# Patient Record
Sex: Male | Born: 1975 | Race: White | Hispanic: No | Marital: Single | Smoking: Never smoker
Health system: Southern US, Community
[De-identification: ages and names within clinical notes are randomized; demographics above are authoritative.]

## PROBLEM LIST (undated history)

## (undated) HISTORY — PX: SPINAL FUSION: SHX223

## (undated) HISTORY — PX: KNEE ARTHROSCOPY WITH ANTERIOR CRUCIATE LIGAMENT (ACL) REPAIR: SHX5644

---

## 2007-08-12 ENCOUNTER — Encounter: Admission: RE | Admit: 2007-08-12 | Discharge: 2007-09-03 | Payer: Self-pay | Admitting: Family Medicine

## 2007-09-05 ENCOUNTER — Encounter: Admission: RE | Admit: 2007-09-05 | Discharge: 2007-09-23 | Payer: Self-pay | Admitting: Family Medicine

## 2009-09-12 ENCOUNTER — Encounter: Admission: RE | Admit: 2009-09-12 | Discharge: 2009-09-12 | Payer: Self-pay | Admitting: Orthopedic Surgery

## 2017-05-14 DIAGNOSIS — B09 Unspecified viral infection characterized by skin and mucous membrane lesions: Secondary | ICD-10-CM | POA: Diagnosis not present

## 2017-09-04 DIAGNOSIS — Z23 Encounter for immunization: Secondary | ICD-10-CM | POA: Diagnosis not present

## 2017-09-04 DIAGNOSIS — Z0000 Encounter for general adult medical examination without abnormal findings: Secondary | ICD-10-CM | POA: Diagnosis not present

## 2017-09-04 DIAGNOSIS — Z13220 Encounter for screening for lipoid disorders: Secondary | ICD-10-CM | POA: Diagnosis not present

## 2017-10-09 DIAGNOSIS — R52 Pain, unspecified: Secondary | ICD-10-CM | POA: Diagnosis not present

## 2018-04-30 DIAGNOSIS — M25562 Pain in left knee: Secondary | ICD-10-CM | POA: Diagnosis not present

## 2018-04-30 DIAGNOSIS — M23612 Other spontaneous disruption of anterior cruciate ligament of left knee: Secondary | ICD-10-CM | POA: Diagnosis not present

## 2018-05-08 DIAGNOSIS — M23612 Other spontaneous disruption of anterior cruciate ligament of left knee: Secondary | ICD-10-CM | POA: Diagnosis not present

## 2018-05-12 DIAGNOSIS — S83512D Sprain of anterior cruciate ligament of left knee, subsequent encounter: Secondary | ICD-10-CM | POA: Diagnosis not present

## 2018-05-12 DIAGNOSIS — S83242D Other tear of medial meniscus, current injury, left knee, subsequent encounter: Secondary | ICD-10-CM | POA: Diagnosis not present

## 2018-07-29 DIAGNOSIS — G8918 Other acute postprocedural pain: Secondary | ICD-10-CM | POA: Diagnosis not present

## 2018-07-29 DIAGNOSIS — S83512A Sprain of anterior cruciate ligament of left knee, initial encounter: Secondary | ICD-10-CM | POA: Diagnosis not present

## 2018-07-29 DIAGNOSIS — M94262 Chondromalacia, left knee: Secondary | ICD-10-CM | POA: Diagnosis not present

## 2018-07-30 ENCOUNTER — Emergency Department (HOSPITAL_COMMUNITY)
Admission: EM | Admit: 2018-07-30 | Discharge: 2018-07-31 | Disposition: A | Discharge status: Home / Self Care | Payer: 59 | Attending: Emergency Medicine | Admitting: Emergency Medicine

## 2018-07-30 ENCOUNTER — Encounter (HOSPITAL_COMMUNITY): Payer: Self-pay | Admitting: Emergency Medicine

## 2018-07-30 ENCOUNTER — Other Ambulatory Visit: Payer: Self-pay

## 2018-07-30 DIAGNOSIS — M25562 Pain in left knee: Secondary | ICD-10-CM | POA: Diagnosis not present

## 2018-07-30 DIAGNOSIS — G8918 Other acute postprocedural pain: Secondary | ICD-10-CM | POA: Insufficient documentation

## 2018-07-30 NOTE — ED Triage Notes (Signed)
Patient presents with knee pain s/p knee surgery at 0900. Patient states no increase in drainage or swelling or redness. Patient ambulatory on crutches.

## 2018-07-31 MED ORDER — ALUM & MAG HYDROXIDE-SIMETH 200-200-20 MG/5ML PO SUSP
30.0000 mL | Freq: Once | ORAL | Status: AC
  Administered 2018-07-31: 30 mL via ORAL
  Filled 2018-07-31: qty 30

## 2018-07-31 MED ORDER — FAMOTIDINE IN NACL 20-0.9 MG/50ML-% IV SOLN
20.0000 mg | Freq: Once | INTRAVENOUS | Status: AC
  Administered 2018-07-31: 20 mg via INTRAVENOUS
  Filled 2018-07-31: qty 50

## 2018-07-31 MED ORDER — FENTANYL CITRATE (PF) 100 MCG/2ML IJ SOLN
100.0000 ug | Freq: Once | INTRAMUSCULAR | Status: AC
  Administered 2018-07-31: 100 ug via INTRAVENOUS
  Filled 2018-07-31: qty 2

## 2018-07-31 MED ORDER — PANTOPRAZOLE SODIUM 40 MG PO TBEC
40.0000 mg | DELAYED_RELEASE_TABLET | Freq: Once | ORAL | Status: AC
  Administered 2018-07-31: 40 mg via ORAL
  Filled 2018-07-31: qty 1

## 2018-07-31 NOTE — ED Provider Notes (Signed)
[facility]-EMERGENCY DEPT Provider Note: Lowella Dell, MD, FACEP  CSN: [id number] MRN: [id number] ARRIVAL: 07/30/18 at 2310 ROOM: WA22/WA22   CHIEF COMPLAINT  Post-op Problem (Pain)   HISTORY OF PRESENT ILLNESS  07/31/18 12:35 AM Benjamin Saunders is a 42 y.o. male who underwent a laparoscopic left ACL and meniscal repair by Dr. Thomasena Edis of [facility] 2 days ago.  He is having severe pain in his left knee which she describes as feeling like his left knee is being hyperextended and twisted.  He did not get adequate relief with the oxycodone he was prescribed postoperatively.  Yesterday he was prescribed hydromorphone.  Despite taking 4 mg of hydromorphone at 10 PM yesterday evening he is still having severe pain in that knee.  He denies postoperative injury to that knee.  He denies swelling or discoloration of the knee.  He has been applying ice as instructed.  His legs are in compression stockings for DVT prophylaxis.  He also complains of indigestion since surgery.    No past medical history on file.  Past Surgical History:  Procedure Laterality Date  . KNEE ARTHROSCOPY WITH ANTERIOR CRUCIATE LIGAMENT (ACL) REPAIR    . SPINAL FUSION      No family history on file.  Social History   Tobacco Use  . Smoking status: Never Smoker  . Smokeless tobacco: Never Used  Substance Use Topics  . Alcohol use: Yes    Comment: occassional  . Drug use: Never    Prior to Admission medications   Medication Sig Start Date End Date Taking? Authorizing Provider  doxycycline (VIBRAMYCIN) 100 MG capsule Take 100 mg by mouth 2 (two) times daily. 07/29/18  Yes [provider]  HYDROmorphone (DILAUDID) 2 MG tablet Take 2 mg by mouth every 4 (four) hours as needed for pain. 07/30/18  Yes [provider]  methocarbamol (ROBAXIN) 500 MG tablet Take 500 mg by mouth 3 (three) times daily as needed for muscle spasms. 07/29/18  Yes [provider]    Allergies Penicillins   REVIEW OF  SYSTEMS  Negative except as noted here or in the History of Present Illness.   PHYSICAL EXAMINATION  Initial Vital Signs Blood pressure 122/66, pulse 87, temperature 98.2 F (36.8 C), temperature source Oral, resp. rate 20, height 5\' 9"  (1.753 m), weight 91 kg, SpO2 98 %.  Examination General: Well-developed, well-nourished male in no acute distress; appearance consistent with age of record HENT: normocephalic; atraumatic Eyes: pupils equal, round and reactive to light; extraocular muscles intact Neck: supple Heart: regular rate and rhythm Lungs: clear to auscultation bilaterally Abdomen: soft; nondistended; nontender; bowel sounds present Extremities: No deformity; laparoscopic incisions of left knee without signs of infection or bleeding; tenderness of left knee without swelling, ecchymosis or erythema; movement of left knee not attempted due to recent surgery Neurologic: Awake, alert and oriented; motor function intact in all extremities and symmetric; no facial droop Skin: Warm and dry Psychiatric: Normal mood and affect   RESULTS  Summary of this visit's results, reviewed by myself:   EKG Interpretation  Date/Time:    Ventricular Rate:    PR Interval:    QRS Duration:   QT Interval:    QTC Calculation:   R Axis:     Text Interpretation:        Laboratory Studies: No results found for this or any previous visit (from the past 24 hour(s)). Imaging Studies: No results found.  ED COURSE and MDM  Nursing notes and initial vitals signs,  including pulse oximetry, reviewed.  Vitals:   07/30/18 2323 07/30/18 2324 07/31/18 0100 07/31/18 0106  BP: 122/66  (!) 130/92   Pulse: 87  85 74  Resp: 20  18   Temp: 98.2 F (36.8 C)     TempSrc: Oral     SpO2: 98%  94% 96%  Weight:  91 kg    Height:  5\' 9"  (1.753 m)     2:44 AM Patient's pain significantly relieved with fentanyl 100 mcg IV.  He states he is ready to go home.  PROCEDURES    ED DIAGNOSES     ICD-10-CM    1. Postoperative pain G89.18        Zehra Rucci, Jonny RuizJohn, MD 07/31/18 [phone]

## 2018-07-31 NOTE — ED Notes (Signed)
Incisions do not show signs of infection, knee is not hot to the touch

## 2018-08-04 DIAGNOSIS — M25562 Pain in left knee: Secondary | ICD-10-CM | POA: Diagnosis not present

## 2018-08-08 DIAGNOSIS — M25562 Pain in left knee: Secondary | ICD-10-CM | POA: Diagnosis not present

## 2018-08-12 DIAGNOSIS — M25562 Pain in left knee: Secondary | ICD-10-CM | POA: Diagnosis not present

## 2018-08-14 DIAGNOSIS — M25562 Pain in left knee: Secondary | ICD-10-CM | POA: Diagnosis not present

## 2018-08-15 DIAGNOSIS — M25562 Pain in left knee: Secondary | ICD-10-CM | POA: Diagnosis not present

## 2018-08-18 DIAGNOSIS — Z4789 Encounter for other orthopedic aftercare: Secondary | ICD-10-CM | POA: Diagnosis not present

## 2018-08-18 DIAGNOSIS — M25562 Pain in left knee: Secondary | ICD-10-CM | POA: Diagnosis not present

## 2018-08-21 DIAGNOSIS — M25562 Pain in left knee: Secondary | ICD-10-CM | POA: Diagnosis not present

## 2018-08-22 DIAGNOSIS — M25562 Pain in left knee: Secondary | ICD-10-CM | POA: Diagnosis not present

## 2018-08-26 DIAGNOSIS — M25562 Pain in left knee: Secondary | ICD-10-CM | POA: Diagnosis not present

## 2018-08-28 DIAGNOSIS — M25562 Pain in left knee: Secondary | ICD-10-CM | POA: Diagnosis not present

## 2018-08-29 DIAGNOSIS — M25562 Pain in left knee: Secondary | ICD-10-CM | POA: Diagnosis not present

## 2018-09-02 DIAGNOSIS — M25562 Pain in left knee: Secondary | ICD-10-CM | POA: Diagnosis not present

## 2018-09-04 DIAGNOSIS — M25562 Pain in left knee: Secondary | ICD-10-CM | POA: Diagnosis not present

## 2018-09-08 DIAGNOSIS — S83512D Sprain of anterior cruciate ligament of left knee, subsequent encounter: Secondary | ICD-10-CM | POA: Diagnosis not present

## 2018-09-09 DIAGNOSIS — M25562 Pain in left knee: Secondary | ICD-10-CM | POA: Diagnosis not present

## 2018-09-11 DIAGNOSIS — M25562 Pain in left knee: Secondary | ICD-10-CM | POA: Diagnosis not present

## 2018-09-15 DIAGNOSIS — M25562 Pain in left knee: Secondary | ICD-10-CM | POA: Diagnosis not present

## 2018-09-18 DIAGNOSIS — M25562 Pain in left knee: Secondary | ICD-10-CM | POA: Diagnosis not present

## 2018-09-22 DIAGNOSIS — M25562 Pain in left knee: Secondary | ICD-10-CM | POA: Diagnosis not present

## 2018-09-24 DIAGNOSIS — M25562 Pain in left knee: Secondary | ICD-10-CM | POA: Diagnosis not present

## 2018-09-30 DIAGNOSIS — M25562 Pain in left knee: Secondary | ICD-10-CM | POA: Diagnosis not present

## 2018-10-02 DIAGNOSIS — M25562 Pain in left knee: Secondary | ICD-10-CM | POA: Diagnosis not present

## 2018-10-06 DIAGNOSIS — M25562 Pain in left knee: Secondary | ICD-10-CM | POA: Diagnosis not present

## 2018-10-08 DIAGNOSIS — M25562 Pain in left knee: Secondary | ICD-10-CM | POA: Diagnosis not present

## 2018-10-10 DIAGNOSIS — M25562 Pain in left knee: Secondary | ICD-10-CM | POA: Diagnosis not present

## 2018-10-14 DIAGNOSIS — M25562 Pain in left knee: Secondary | ICD-10-CM | POA: Diagnosis not present

## 2018-10-16 DIAGNOSIS — M25562 Pain in left knee: Secondary | ICD-10-CM | POA: Diagnosis not present

## 2018-10-16 DIAGNOSIS — G479 Sleep disorder, unspecified: Secondary | ICD-10-CM | POA: Diagnosis not present

## 2018-10-16 DIAGNOSIS — B360 Pityriasis versicolor: Secondary | ICD-10-CM | POA: Diagnosis not present

## 2018-10-16 DIAGNOSIS — Z0000 Encounter for general adult medical examination without abnormal findings: Secondary | ICD-10-CM | POA: Diagnosis not present

## 2018-10-17 DIAGNOSIS — G479 Sleep disorder, unspecified: Secondary | ICD-10-CM | POA: Diagnosis not present

## 2018-10-17 DIAGNOSIS — B360 Pityriasis versicolor: Secondary | ICD-10-CM | POA: Diagnosis not present

## 2018-10-17 DIAGNOSIS — Z0000 Encounter for general adult medical examination without abnormal findings: Secondary | ICD-10-CM | POA: Diagnosis not present

## 2018-10-23 DIAGNOSIS — M25562 Pain in left knee: Secondary | ICD-10-CM | POA: Diagnosis not present

## 2018-10-27 DIAGNOSIS — M25562 Pain in left knee: Secondary | ICD-10-CM | POA: Diagnosis not present

## 2018-10-27 DIAGNOSIS — Z4789 Encounter for other orthopedic aftercare: Secondary | ICD-10-CM | POA: Diagnosis not present

## 2018-11-06 DIAGNOSIS — M25562 Pain in left knee: Secondary | ICD-10-CM | POA: Diagnosis not present

## 2018-11-10 DIAGNOSIS — M25562 Pain in left knee: Secondary | ICD-10-CM | POA: Diagnosis not present

## 2018-11-12 DIAGNOSIS — M25562 Pain in left knee: Secondary | ICD-10-CM | POA: Diagnosis not present

## 2018-11-17 DIAGNOSIS — M25562 Pain in left knee: Secondary | ICD-10-CM | POA: Diagnosis not present

## 2018-12-01 DIAGNOSIS — M25562 Pain in left knee: Secondary | ICD-10-CM | POA: Diagnosis not present

## 2018-12-05 DIAGNOSIS — Z9889 Other specified postprocedural states: Secondary | ICD-10-CM | POA: Diagnosis not present

## 2019-01-19 DIAGNOSIS — M1712 Unilateral primary osteoarthritis, left knee: Secondary | ICD-10-CM | POA: Diagnosis not present

## 2019-01-19 DIAGNOSIS — Z96651 Presence of right artificial knee joint: Secondary | ICD-10-CM | POA: Diagnosis not present

## 2019-01-19 DIAGNOSIS — Z471 Aftercare following joint replacement surgery: Secondary | ICD-10-CM | POA: Diagnosis not present

## 2020-02-05 ENCOUNTER — Emergency Department (HOSPITAL_COMMUNITY)
Admission: EM | Admit: 2020-02-05 | Discharge: 2020-02-05 | Disposition: A | Discharge status: Home / Self Care | Payer: 59 | Attending: Emergency Medicine | Admitting: Emergency Medicine

## 2020-02-05 ENCOUNTER — Other Ambulatory Visit: Payer: Self-pay

## 2020-02-05 ENCOUNTER — Encounter (HOSPITAL_COMMUNITY): Payer: Self-pay

## 2020-02-05 ENCOUNTER — Emergency Department (HOSPITAL_COMMUNITY): Payer: 59

## 2020-02-05 DIAGNOSIS — Z9889 Other specified postprocedural states: Secondary | ICD-10-CM | POA: Diagnosis not present

## 2020-02-05 DIAGNOSIS — M25561 Pain in right knee: Secondary | ICD-10-CM | POA: Diagnosis not present

## 2020-02-05 DIAGNOSIS — G8918 Other acute postprocedural pain: Secondary | ICD-10-CM | POA: Insufficient documentation

## 2020-02-05 MED ORDER — HYDROMORPHONE HCL 1 MG/ML IJ SOLN
1.0000 mg | Freq: Once | INTRAMUSCULAR | Status: AC
Start: 2020-02-05 — End: 2020-02-05
  Administered 2020-02-05: 1 mg via INTRAMUSCULAR
  Filled 2020-02-05: qty 1

## 2020-02-05 MED ORDER — FENTANYL CITRATE (PF) 100 MCG/2ML IJ SOLN
100.0000 ug | Freq: Once | INTRAMUSCULAR | Status: AC
  Administered 2020-02-05: 100 ug via INTRAMUSCULAR
  Filled 2020-02-05: qty 2

## 2020-02-05 MED ORDER — FENTANYL CITRATE (PF) 100 MCG/2ML IJ SOLN: 100.0000 ug | Freq: Once | INTRAMUSCULAR | Status: DC

## 2020-02-05 MED ORDER — FENTANYL CITRATE (PF) 100 MCG/2ML IJ SOLN
50.0000 ug | Freq: Once | INTRAMUSCULAR | Status: AC
Start: 2020-02-05 — End: 2020-02-05
  Administered 2020-02-05: 50 ug via INTRAMUSCULAR
  Filled 2020-02-05: qty 2

## 2020-02-05 NOTE — Discharge Instructions (Addendum)
You received fentanyl, Dilaudid while in the ED.  Your primary surgical team has called in Dilaudid p.o. at your pharmacy of choice.  You may continue taking this along the muscle relaxers to help with your symptoms.  Follow-up at your scheduled appointment on Monday morning.

## 2020-02-05 NOTE — ED Provider Notes (Addendum)
[facility] Provider Note   CSN: [id number] Arrival date & time: 02/05/20  1459     History Chief Complaint  Patient presents with  . post op issues    Benjamin Saunders is a 44 y.o. male.  44 y.o male with a PMH of right ACL reconstruction performed yesterday by Dr. Autumn Messing at [facility] presents to the ED with a chief complaint of increasing pain.  Patient reports his surgery was done at the surgery center, he has had increase in pain and has been unable to manage his pain medication while at home.  He reports he was given a prescription for hydrocodone 5 mg, methocarbamol, antibiotics which she has taken without improvement in his symptoms.  Reports calling the RN at the surgery center who recommended he double up on the hydrocodone, he last took hydrocodone around 3 PM this afternoon exactly an hour prior to arrival.  States the pain has not improved.  Reports pain is exacerbated with any type of movement.  Had a similar episode when he had a reconstruction of his left ACL.  Patient was discharged this morning from surgery center.  Reports no fevers, chills, other complaints.  Patient is on a knee immobilizer, dressing was checked this morning prior to discharge according to patient.   The history is provided by the patient and medical records.       History reviewed. No pertinent past medical history.  There are no problems to display for this patient.   Past Surgical History:  Procedure Laterality Date  . KNEE ARTHROSCOPY WITH ANTERIOR CRUCIATE LIGAMENT (ACL) REPAIR    . SPINAL FUSION         Family History  Problem Relation Age of Onset  . Heart failure Mother   . Hypertension Mother     Social History   Tobacco Use  . Smoking status: Never Smoker  . Smokeless tobacco: Never Used  Substance Use Topics  . Alcohol use: Yes    Comment: occassional  . Drug use: Never    Home Medications Prior to Admission  medications   Medication Sig Start Date End Date Taking? Authorizing Provider  doxycycline (VIBRAMYCIN) 100 MG capsule Take 100 mg by mouth 2 (two) times daily. 07/29/18   [provider]  HYDROmorphone (DILAUDID) 2 MG tablet Take 2 mg by mouth every 4 (four) hours as needed for pain. 07/30/18   [provider]  methocarbamol (ROBAXIN) 500 MG tablet Take 500 mg by mouth 3 (three) times daily as needed for muscle spasms. 07/29/18   [provider]    Allergies    Penicillins  Review of Systems   Review of Systems  Constitutional: Negative for fever.  Musculoskeletal: Positive for arthralgias and joint swelling.    Physical Exam Updated Vital Signs BP 140/79   Pulse 79   Temp 98.2 F (36.8 C) (Oral)   Resp 18   Ht 5\' 9"  (1.753 m)   Wt 88.5 kg   SpO2 99%   BMI 28.80 kg/m   Physical Exam Vitals and nursing note reviewed.  Constitutional:      Appearance: Normal appearance.  HENT:     Head: Normocephalic and atraumatic.  Cardiovascular:     Rate and Rhythm: Normal rate.     Pulses:          Dorsalis pedis pulses are 2+ on the right side.       Posterior tibial pulses are 2+ on the right side.  Pulmonary:     Effort: Pulmonary effort is normal.  Abdominal:     General: Abdomen is flat.  Musculoskeletal:        General: Tenderness present.     Cervical back: Normal range of motion and neck supple.     Right knee: Tenderness present.     Comments: Right knee placed on immobilizer.  Sensation is intact to the distal aspect, pulses are present, capillary refill is intact to his toes.  Tenderness surrounding wound.  Knee is hyperextended.  Immobilizer removed with minimal swelling noted, please see photos attached.  Skin:    General: Skin is warm and dry.  Neurological:     Mental Status: He is alert and oriented to person, place, and time.       ED Results / Procedures / Treatments   Labs (all labs ordered are listed, but only abnormal  results are displayed) Labs Reviewed - No data to display  EKG None  Radiology No results found.  Procedures Procedures (including critical care time)  Medications Ordered in ED Medications  fentaNYL (SUBLIMAZE) injection 50 mcg (50 mcg Intramuscular Given 02/05/20 1611)  HYDROmorphone (DILAUDID) injection 1 mg (1 mg Intramuscular Given 02/05/20 1719)  fentaNYL (SUBLIMAZE) injection 100 mcg (100 mcg Intramuscular Given 02/05/20 1823)    ED Course  I have reviewed the triage vital signs and the nursing notes.  Pertinent labs & imaging results that were available during my care of the patient were reviewed by me and considered in my medical decision making (see chart for details).    MDM Rules/Calculators/A&P  Patient with a PMH of right ACL reconstruction performed by Dr. Thomasena Edis yesterday, reports being admitted overnight for monitoring.  Discharged this morning in stable condition.  Reports increase in pain, has had his pain medication which was prescribed for him at home including hydrocodone 5 mg, Robaxin, antibiotics that he thinks were prescribed for him.  Reports no improvement in his pain.  Attempted to double up pain medication but reports there was no improvement.  According to him when he had his left ACL reconstructed by Dr. Thomasena Edis, he did require IV medication while in the ED the next day.  During evaluation right knee is on the knee immobilizer, wound and dressing was checked this morning by nursing staff according to patient.  Pulses are present distally, capillary refill is intact, sensation is adequate.  Provide patient with pain medication while in the ED and further reevaluate.  Xray of the right knee showed:  1. Small to moderate sized joint effusion with a mild amount of soft  tissue air seen above and below the right patella.  2. Postoperative changes, as described above, without evidence of  acute osseous abnormality.    5:26 PM Spoke to [facility],  who prescribed patient Dilaudid p.o. while at home.  I have updated a picture of the patient's knee on his chart, minimal swelling was noted, he is neurovascularly intact.  He reports mild improvement after 1 mg of Dilaudid IM.  He is asking if he could take Dilaudid p.o. x2 while at home.  I advised him that he tolerated this last time he may continue to do so.  He is otherwise hemodynamically stable, stable for discharge.  6:10 PM patient is refusing discharge at this time, will provide him with additional pain medication.  Of note, patient does have a prescription for Dilaudid at his current pharmacy which he is advised to pick up by his primary  Psychologist, sport and exercise.  Portions of this note were generated with [employer]. Dictation errors may occur despite best attempts at proofreading.  Final Clinical Impression(s) / ED Diagnoses Final diagnoses:  Post-operative pain  Acute pain of right knee    Rx / DC Orders ED Discharge Orders    None       Janeece Fitting, PA-C 02/05/20 [place], Nhan Qualley, PA-C 02/07/20 2357    Daleen Bo, MD 02/13/20 [phone]

## 2020-02-05 NOTE — ED Notes (Signed)
An After Visit Summary was printed and given to the patient. Discharge instructions given and no further questions at this time.  

## 2020-02-05 NOTE — ED Triage Notes (Signed)
Patient states he had right knee surgery yesterday. Patient states he is is excrutiating pain. Patient states the same thing happened with my other knee. "I received a hot of Fentanyl and everything was good after that."

## 2020-02-05 NOTE — ED Notes (Signed)
Pt states his mother drove him here and is taking him home. Pt states we did not manage his pain well. Pt given multiple doses of pain medications (see MAR). Pt knee has minimal swelling as seen by this RN and Claude Manges, PA. Pt also has pain medications ordered from his surgical office to his pharmacy and is aware of this.

## 2021-02-25 IMAGING — CR DG KNEE 1-2V*R*
2 series · 2 of 2 positions shown · non-contrast
Comparison: None.

CLINICAL DATA: Status post right knee surgery yesterday with
subsequent pain.

EXAM:
RIGHT KNEE - 1-2 VIEW

[t knee ap right]
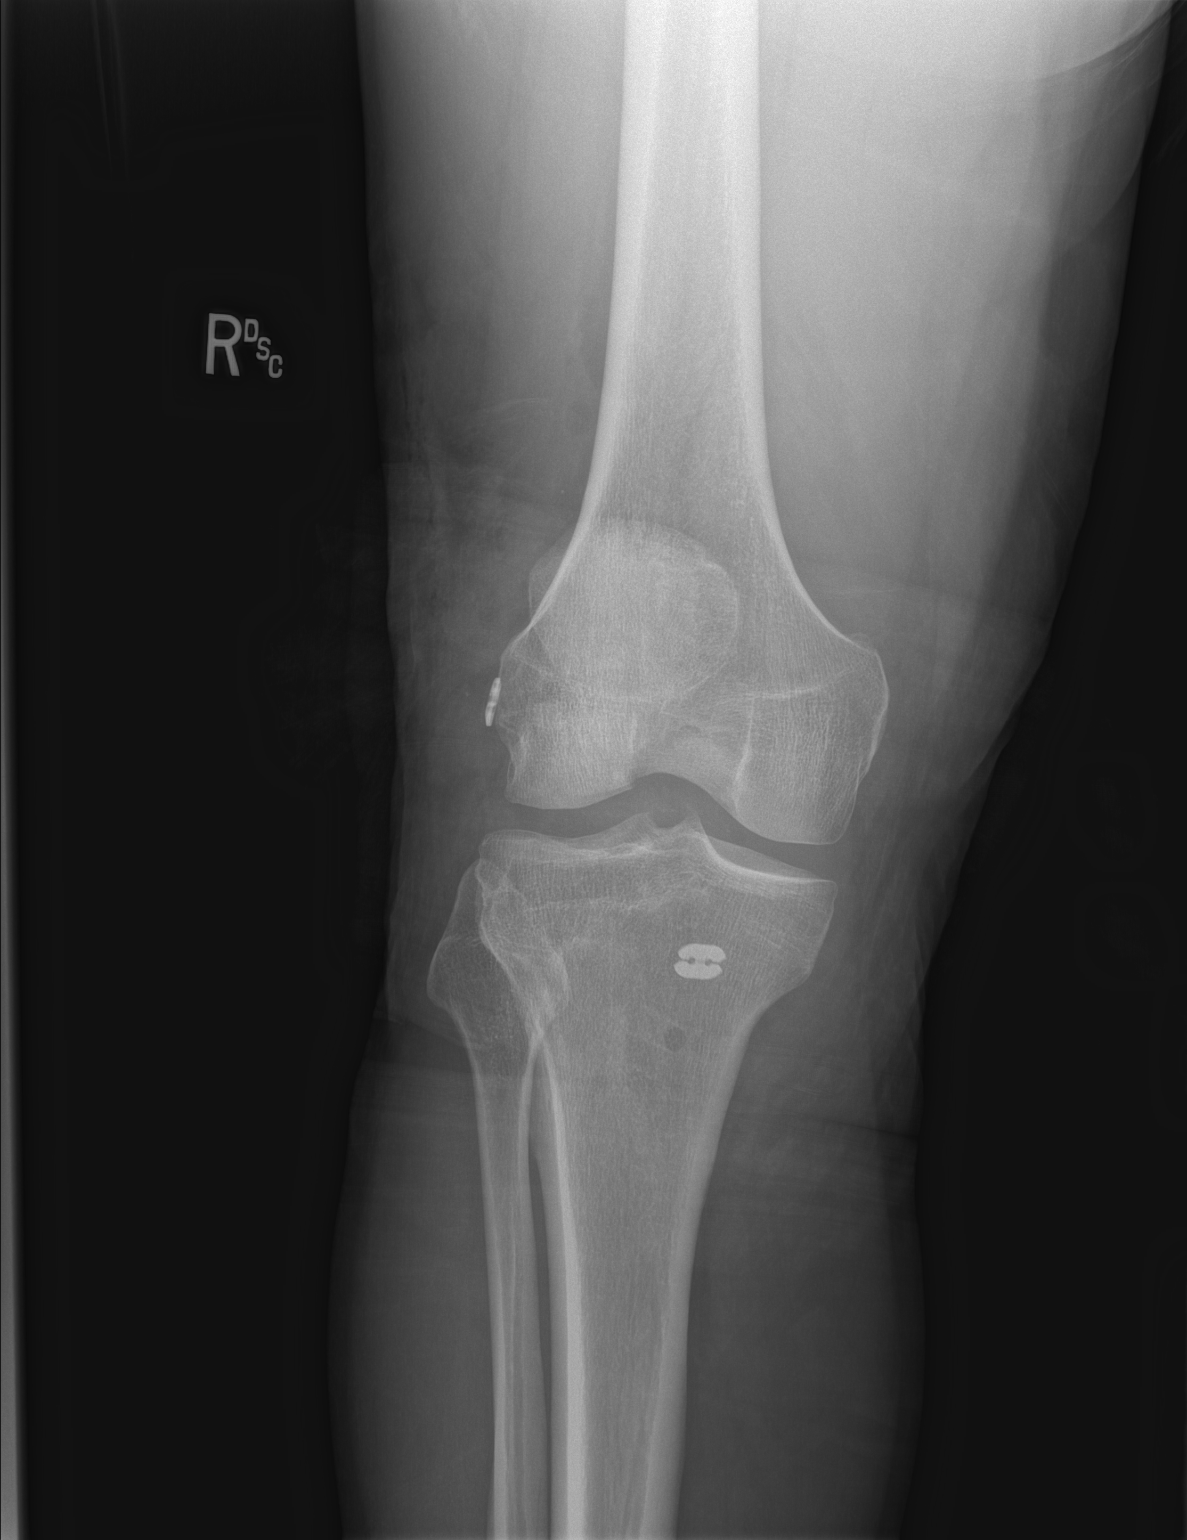

[x knee lat right]
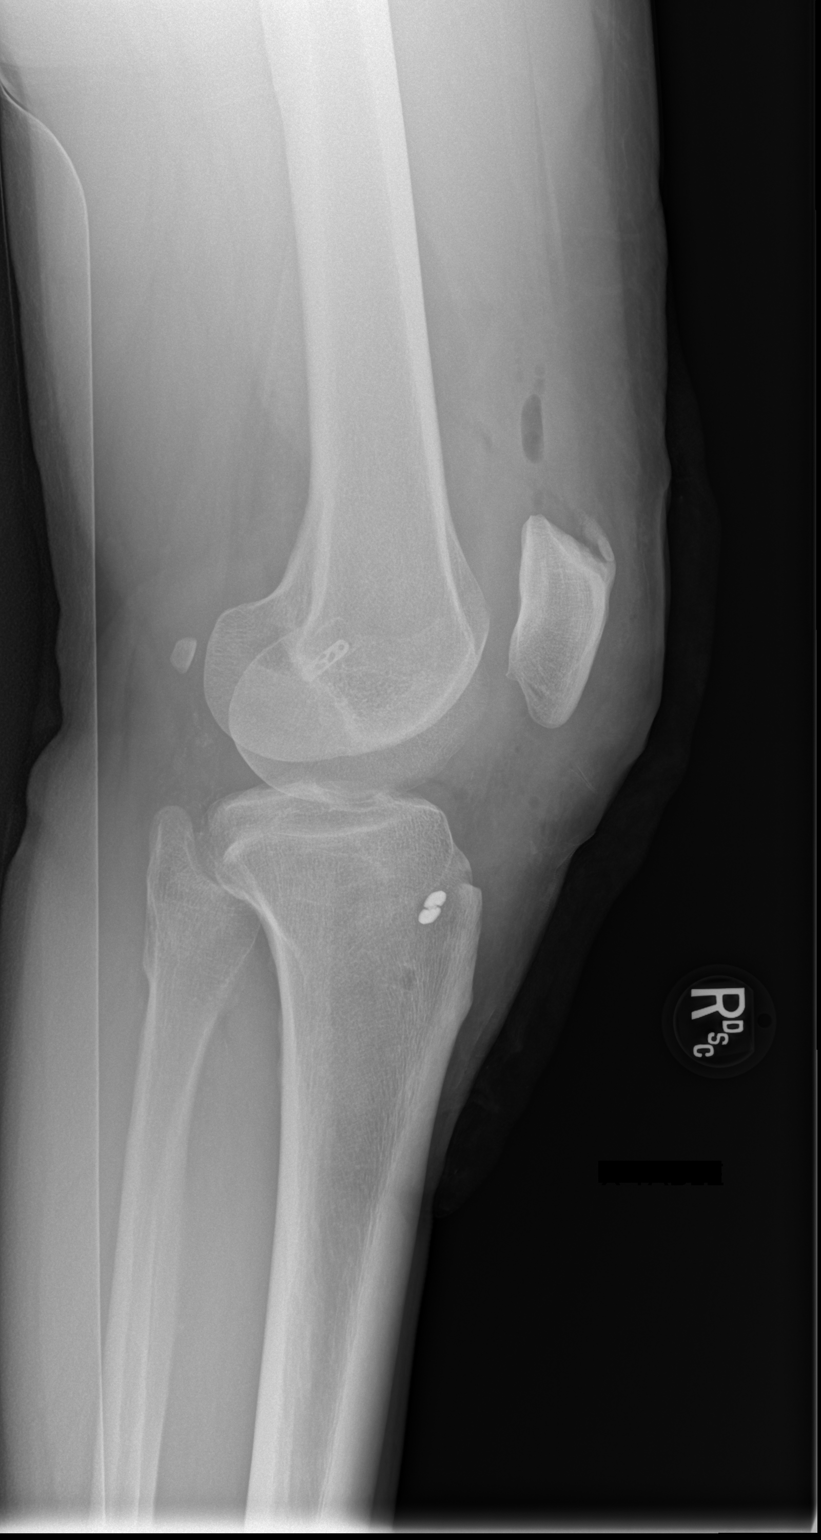

[2 of 2 positions shown; findings below may reference images not displayed]

FINDINGS: No evidence of acute fracture or dislocation. Small radiopaque
surgical plates are seen along the lateral epicondyle of the distal
right humerus and anteromedial aspect of the proximal right tibia.
No evidence of arthropathy or other focal bone abnormality. A small
to moderate sized joint effusion is seen with a mild amount of soft
tissue air seen above and below the right patella.
IMPRESSION: 1. Small to moderate sized joint effusion with a mild amount of soft
tissue air seen above and below the right patella.
2. Postoperative changes, as described above, without evidence of
acute osseous abnormality.

## 2022-01-26 DIAGNOSIS — Z0000 Encounter for general adult medical examination without abnormal findings: Secondary | ICD-10-CM | POA: Diagnosis not present

## 2022-01-26 DIAGNOSIS — E559 Vitamin D deficiency, unspecified: Secondary | ICD-10-CM | POA: Diagnosis not present

## 2022-01-26 DIAGNOSIS — Z13220 Encounter for screening for lipoid disorders: Secondary | ICD-10-CM | POA: Diagnosis not present

## 2022-01-31 DIAGNOSIS — Z0000 Encounter for general adult medical examination without abnormal findings: Secondary | ICD-10-CM | POA: Diagnosis not present

## 2022-02-09 DIAGNOSIS — M25562 Pain in left knee: Secondary | ICD-10-CM | POA: Diagnosis not present

## 2022-02-09 DIAGNOSIS — M23307 Other meniscus derangements, unspecified meniscus, left knee: Secondary | ICD-10-CM | POA: Diagnosis not present

## 2022-02-10 DIAGNOSIS — M10072 Idiopathic gout, left ankle and foot: Secondary | ICD-10-CM | POA: Diagnosis not present

## 2022-03-09 DIAGNOSIS — G4719 Other hypersomnia: Secondary | ICD-10-CM | POA: Diagnosis not present

## 2022-03-23 DIAGNOSIS — M23306 Other meniscus derangements, unspecified meniscus, right knee: Secondary | ICD-10-CM | POA: Diagnosis not present

## 2022-03-23 DIAGNOSIS — M25562 Pain in left knee: Secondary | ICD-10-CM | POA: Diagnosis not present

## 2022-04-10 DIAGNOSIS — M10079 Idiopathic gout, unspecified ankle and foot: Secondary | ICD-10-CM | POA: Diagnosis not present

## 2022-04-18 DIAGNOSIS — G4733 Obstructive sleep apnea (adult) (pediatric): Secondary | ICD-10-CM | POA: Diagnosis not present

## 2022-04-30 DIAGNOSIS — G4733 Obstructive sleep apnea (adult) (pediatric): Secondary | ICD-10-CM | POA: Diagnosis not present

## 2022-05-25 DIAGNOSIS — Z23 Encounter for immunization: Secondary | ICD-10-CM | POA: Diagnosis not present

## 2022-05-28 DIAGNOSIS — Z713 Dietary counseling and surveillance: Secondary | ICD-10-CM | POA: Diagnosis not present

## 2022-05-31 DIAGNOSIS — G4733 Obstructive sleep apnea (adult) (pediatric): Secondary | ICD-10-CM | POA: Diagnosis not present

## 2022-06-30 DIAGNOSIS — G4733 Obstructive sleep apnea (adult) (pediatric): Secondary | ICD-10-CM | POA: Diagnosis not present

## 2022-07-17 DIAGNOSIS — G4733 Obstructive sleep apnea (adult) (pediatric): Secondary | ICD-10-CM | POA: Diagnosis not present

## 2022-08-21 DIAGNOSIS — G4733 Obstructive sleep apnea (adult) (pediatric): Secondary | ICD-10-CM | POA: Diagnosis not present

## 2022-09-11 DIAGNOSIS — Z1211 Encounter for screening for malignant neoplasm of colon: Secondary | ICD-10-CM | POA: Diagnosis not present

## 2022-10-24 DIAGNOSIS — G4733 Obstructive sleep apnea (adult) (pediatric): Secondary | ICD-10-CM | POA: Diagnosis not present

## 2022-12-10 DIAGNOSIS — Z713 Dietary counseling and surveillance: Secondary | ICD-10-CM | POA: Diagnosis not present

## 2023-01-31 DIAGNOSIS — E559 Vitamin D deficiency, unspecified: Secondary | ICD-10-CM | POA: Diagnosis not present

## 2023-01-31 DIAGNOSIS — Z13220 Encounter for screening for lipoid disorders: Secondary | ICD-10-CM | POA: Diagnosis not present

## 2023-01-31 DIAGNOSIS — R7301 Impaired fasting glucose: Secondary | ICD-10-CM | POA: Diagnosis not present

## 2023-01-31 DIAGNOSIS — Z0000 Encounter for general adult medical examination without abnormal findings: Secondary | ICD-10-CM | POA: Diagnosis not present

## 2023-02-07 DIAGNOSIS — Z0000 Encounter for general adult medical examination without abnormal findings: Secondary | ICD-10-CM | POA: Diagnosis not present

## 2023-06-25 DIAGNOSIS — M545 Low back pain, unspecified: Secondary | ICD-10-CM | POA: Diagnosis not present

## 2023-06-28 DIAGNOSIS — M545 Low back pain, unspecified: Secondary | ICD-10-CM | POA: Diagnosis not present

## 2023-07-01 DIAGNOSIS — M545 Low back pain, unspecified: Secondary | ICD-10-CM | POA: Diagnosis not present

## 2023-07-03 DIAGNOSIS — M545 Low back pain, unspecified: Secondary | ICD-10-CM | POA: Diagnosis not present

## 2023-07-12 DIAGNOSIS — M545 Low back pain, unspecified: Secondary | ICD-10-CM | POA: Diagnosis not present

## 2023-08-08 DIAGNOSIS — M10042 Idiopathic gout, left hand: Secondary | ICD-10-CM | POA: Diagnosis not present

## 2023-08-12 DIAGNOSIS — M25561 Pain in right knee: Secondary | ICD-10-CM | POA: Diagnosis not present

## 2023-09-07 DIAGNOSIS — M25561 Pain in right knee: Secondary | ICD-10-CM | POA: Diagnosis not present

## 2023-09-19 DIAGNOSIS — M1711 Unilateral primary osteoarthritis, right knee: Secondary | ICD-10-CM | POA: Diagnosis not present

## 2023-09-19 DIAGNOSIS — M2241 Chondromalacia patellae, right knee: Secondary | ICD-10-CM | POA: Diagnosis not present

## 2023-11-26 DIAGNOSIS — Z713 Dietary counseling and surveillance: Secondary | ICD-10-CM | POA: Diagnosis not present

## 2023-12-02 DIAGNOSIS — M109 Gout, unspecified: Secondary | ICD-10-CM | POA: Diagnosis not present

## 2024-03-04 DIAGNOSIS — Z23 Encounter for immunization: Secondary | ICD-10-CM | POA: Diagnosis not present

## 2024-03-04 DIAGNOSIS — Z0000 Encounter for general adult medical examination without abnormal findings: Secondary | ICD-10-CM | POA: Diagnosis not present

## 2024-03-04 DIAGNOSIS — G479 Sleep disorder, unspecified: Secondary | ICD-10-CM | POA: Diagnosis not present

## 2024-03-04 DIAGNOSIS — Z125 Encounter for screening for malignant neoplasm of prostate: Secondary | ICD-10-CM | POA: Diagnosis not present

## 2024-03-04 DIAGNOSIS — R7301 Impaired fasting glucose: Secondary | ICD-10-CM | POA: Diagnosis not present

## 2024-03-04 DIAGNOSIS — Z13220 Encounter for screening for lipoid disorders: Secondary | ICD-10-CM | POA: Diagnosis not present

## 2024-03-04 DIAGNOSIS — R413 Other amnesia: Secondary | ICD-10-CM | POA: Diagnosis not present

## 2024-03-04 DIAGNOSIS — R0981 Nasal congestion: Secondary | ICD-10-CM | POA: Diagnosis not present

## 2024-03-04 DIAGNOSIS — B009 Herpesviral infection, unspecified: Secondary | ICD-10-CM | POA: Diagnosis not present

## 2024-09-15 ENCOUNTER — Ambulatory Visit: Admitting: Neurology

## 2024-10-13 ENCOUNTER — Ambulatory Visit: Admitting: Neurology

## 2025-01-26 ENCOUNTER — Ambulatory Visit: Admitting: Neurology
